# Patient Record
Sex: Male | Born: 1950 | Race: White | Hispanic: No | Marital: Married | Smoking: Never smoker
Health system: Southern US, Community
[De-identification: ages and names within clinical notes are randomized; demographics above are authoritative.]

## PROBLEM LIST (undated history)

## (undated) DIAGNOSIS — N200 Calculus of kidney: Secondary | ICD-10-CM

## (undated) DIAGNOSIS — M778 Other enthesopathies, not elsewhere classified: Secondary | ICD-10-CM

## (undated) DIAGNOSIS — N189 Chronic kidney disease, unspecified: Secondary | ICD-10-CM

## (undated) DIAGNOSIS — K429 Umbilical hernia without obstruction or gangrene: Secondary | ICD-10-CM

## (undated) DIAGNOSIS — M1990 Unspecified osteoarthritis, unspecified site: Secondary | ICD-10-CM

## (undated) DIAGNOSIS — K589 Irritable bowel syndrome without diarrhea: Secondary | ICD-10-CM

## (undated) DIAGNOSIS — M722 Plantar fascial fibromatosis: Secondary | ICD-10-CM

## (undated) DIAGNOSIS — I714 Abdominal aortic aneurysm, without rupture, unspecified: Secondary | ICD-10-CM

## (undated) DIAGNOSIS — K2970 Gastritis, unspecified, without bleeding: Secondary | ICD-10-CM

## (undated) DIAGNOSIS — E785 Hyperlipidemia, unspecified: Secondary | ICD-10-CM

## (undated) DIAGNOSIS — I499 Cardiac arrhythmia, unspecified: Secondary | ICD-10-CM

## (undated) DIAGNOSIS — I10 Essential (primary) hypertension: Secondary | ICD-10-CM

## (undated) HISTORY — PX: CATARACT EXTRACTION: SUR2

## (undated) HISTORY — PX: EYE SURGERY: SHX253

## (undated) HISTORY — PX: JOINT REPLACEMENT: SHX530

## (undated) HISTORY — PX: TONSILLECTOMY: SUR1361

---

## 2008-06-20 ENCOUNTER — Ambulatory Visit: Payer: Self-pay | Admitting: Internal Medicine

## 2008-11-07 ENCOUNTER — Ambulatory Visit: Payer: Self-pay | Admitting: Ophthalmology

## 2008-11-07 ENCOUNTER — Ambulatory Visit: Payer: Self-pay | Admitting: Cardiovascular Disease

## 2008-11-13 ENCOUNTER — Ambulatory Visit: Payer: Self-pay | Admitting: Ophthalmology

## 2008-12-22 ENCOUNTER — Ambulatory Visit: Payer: Self-pay | Admitting: Unknown Physician Specialty

## 2009-04-03 ENCOUNTER — Ambulatory Visit: Payer: Self-pay | Admitting: Internal Medicine

## 2009-04-20 ENCOUNTER — Ambulatory Visit: Payer: Self-pay | Admitting: Unknown Physician Specialty

## 2009-05-30 ENCOUNTER — Ambulatory Visit: Payer: Self-pay | Admitting: Unknown Physician Specialty

## 2010-11-19 ENCOUNTER — Ambulatory Visit: Payer: Self-pay | Admitting: Internal Medicine

## 2011-03-19 ENCOUNTER — Ambulatory Visit: Payer: Self-pay | Admitting: Internal Medicine

## 2011-10-14 ENCOUNTER — Ambulatory Visit: Payer: Self-pay | Admitting: Ophthalmology

## 2011-10-14 LAB — POTASSIUM: Potassium: 3.4 mmol/L — ABNORMAL LOW (ref 3.5–5.1)

## 2011-10-24 ENCOUNTER — Ambulatory Visit: Payer: Self-pay | Admitting: Ophthalmology

## 2012-01-22 IMAGING — CT CT CHEST W/ CM
1 series · 15 of 33 positions shown, 19 images · IV contrast (APPLIED)
Comparison: CT abdomen 04/03/2009

REASON FOR EXAM: Plueritic  Chest Pains Hemopolysis
COMMENTS:

PROCEDURE:     KCT - KCT CHEST WITH CONTRAST  - March 19, 2011  [DATE]
RESULT:     Indication: Pleuritic chest pain
TECHNIQUE: Multiple axial images of the chest are obtained with 100 mL of
Tsovue-0JY intravenous contrast.

[Series 4: pe_avg 3.0 i41f 3 · axial · 0.84mm/px · z∈[-874,-598]mm · 15 of 110 slices shown, 19 images]
[im 9/110  mediastinal]
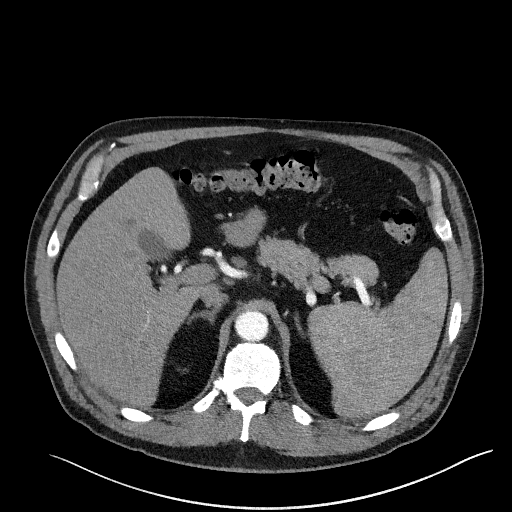
[im 9/110  lung]
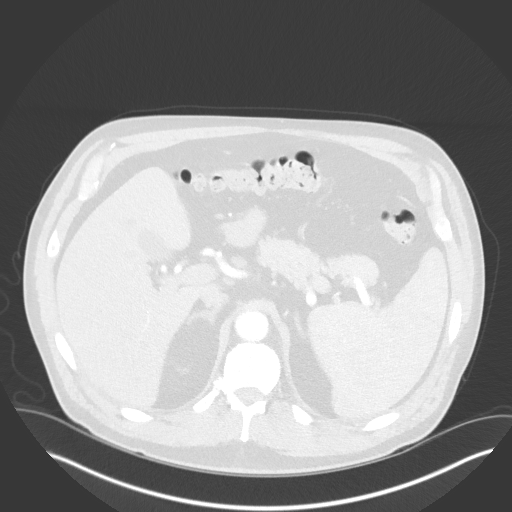
[im 17/110  lung]
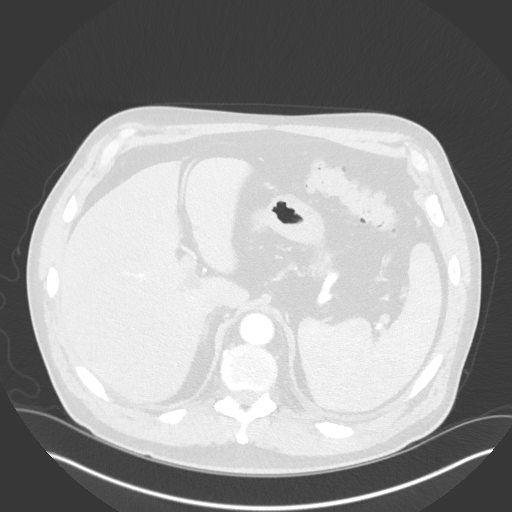
[im 22/110  lung]
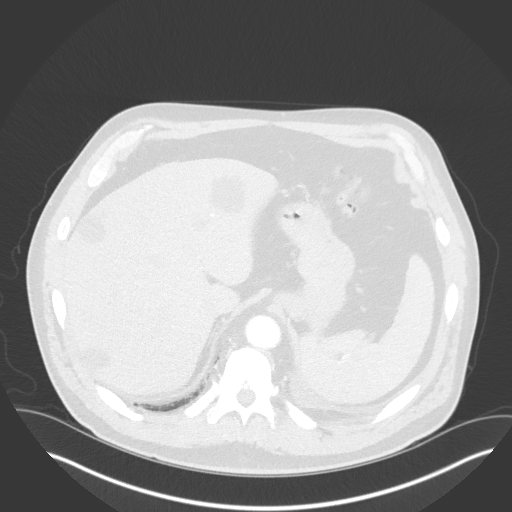
[im 29/110  lung]
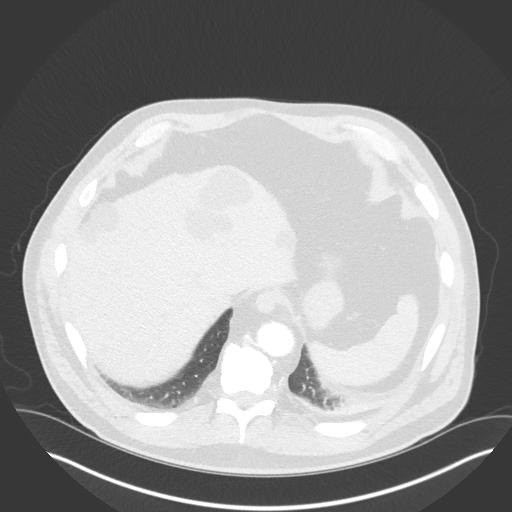
[im 37/110  mediastinal]
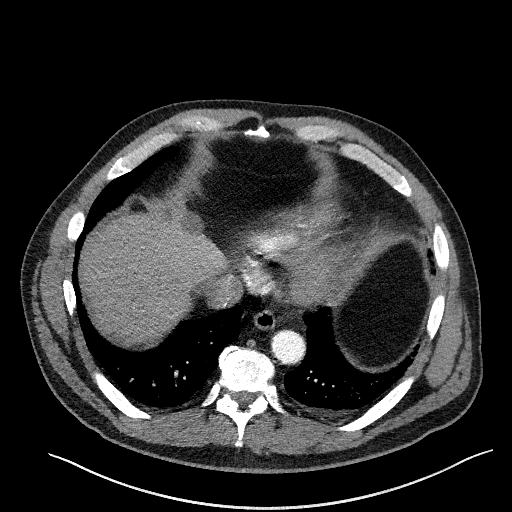
[im 37/110  lung]
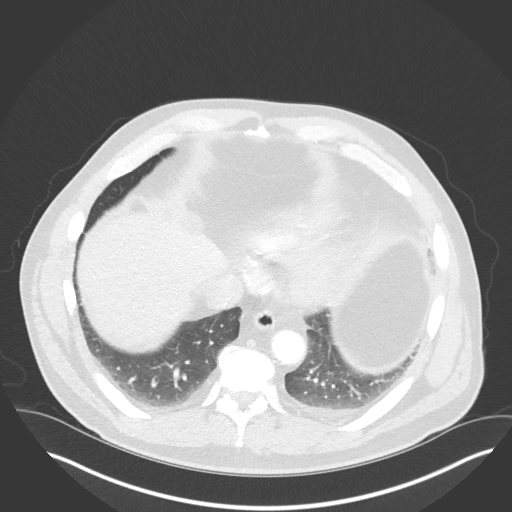
[im 44/110  lung]
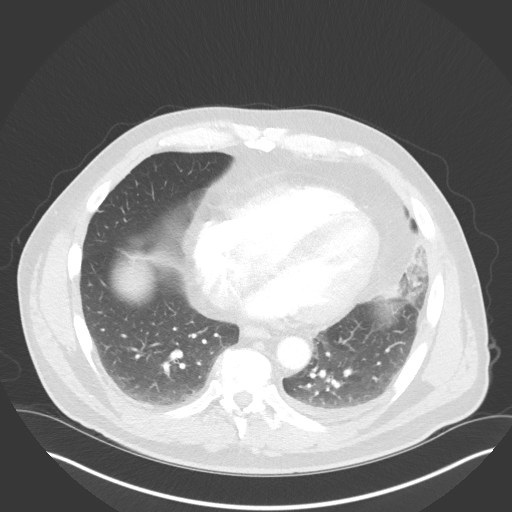
[im 49/110  lung]
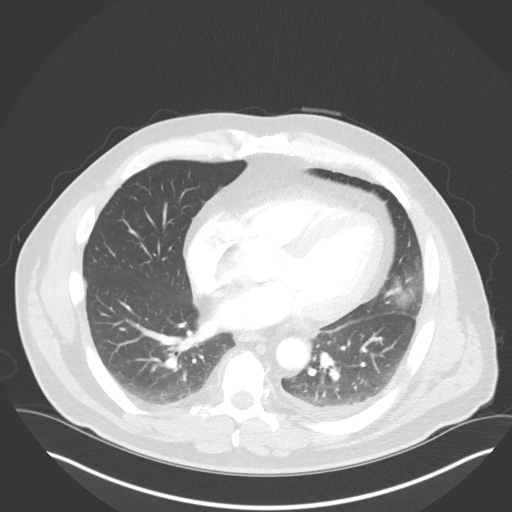
[im 57/110  lung]
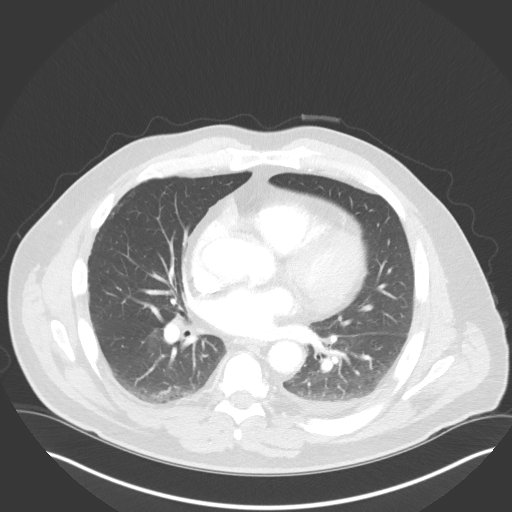
[im 61/110  mediastinal]
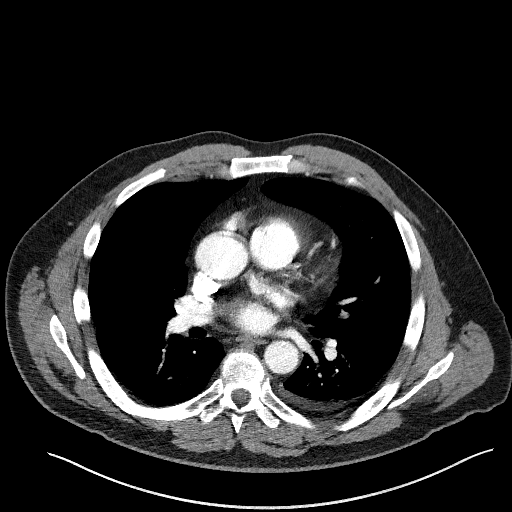
[im 61/110  lung]
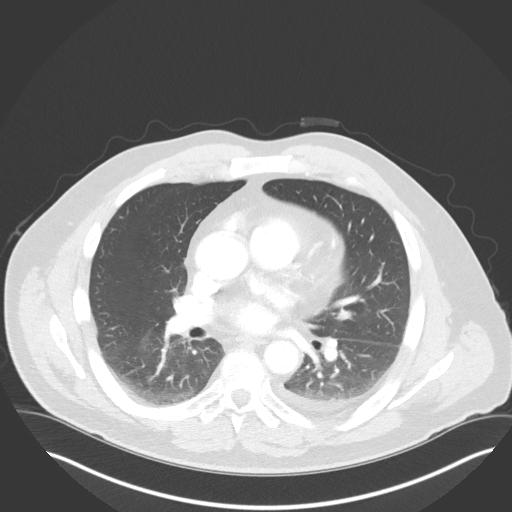
[im 66/110  lung]
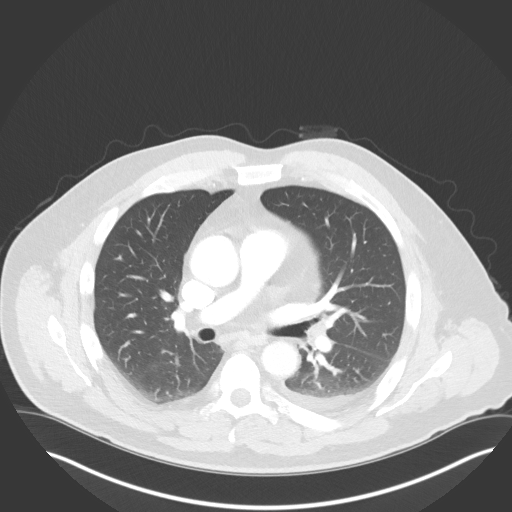
[im 73/110  lung]
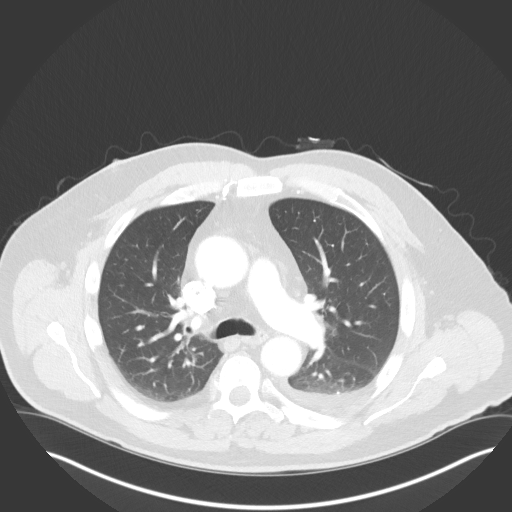
[im 81/110  lung]
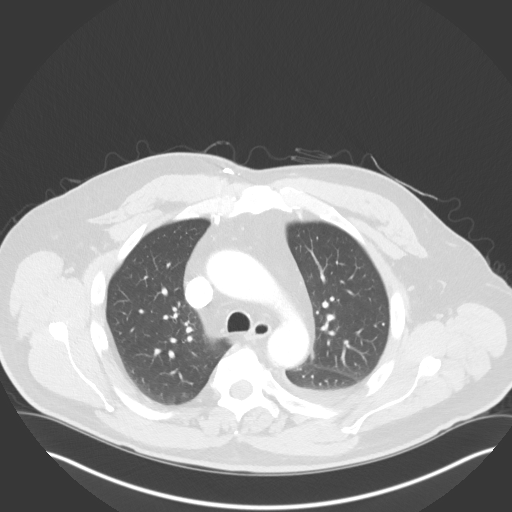
[im 88/110  mediastinal]
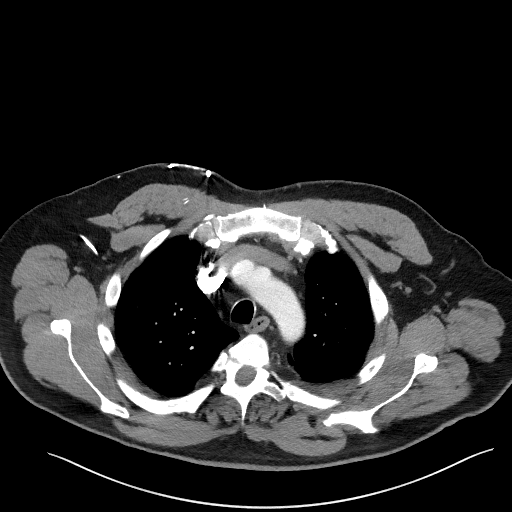
[im 88/110  lung]
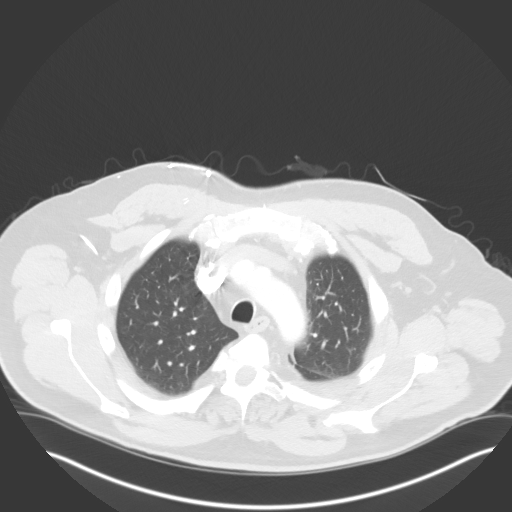
[im 93/110  lung]
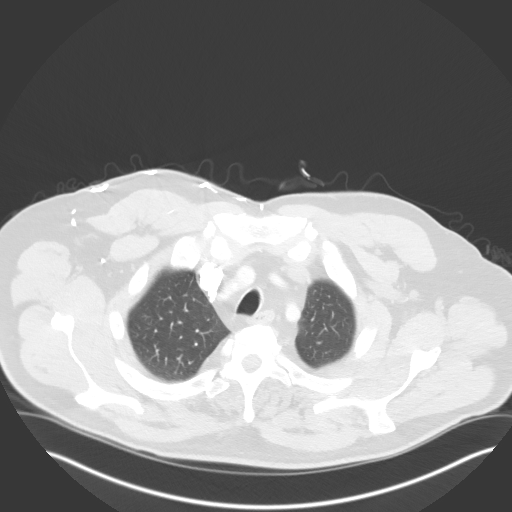
[im 101/110  lung]
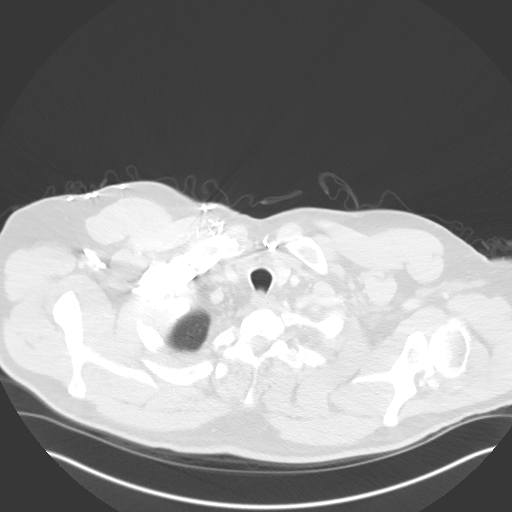

[15 of 33 positions shown; findings below may reference images not displayed]

FINDINGS: The central airways are patent. There is a trace left pleural effusion.
There is lingular airspace disease most concerning for pneumonia. There is
no pleural effusion or pneumothorax.

There are no pathologically enlarged axillary, hilar, or mediastinal lymph
nodes.

The heart size is normal. There is no pericardial effusion. The thoracic
aorta is normal in caliber.

Review of bone windows demonstrates no focal lytic or sclerotic lesions.

Limited noncontrast images of the upper abdomen were obtained. The adrenal
glands appear normal. There multiple hypodense, fluid attenuating hepatic
mass is similar in appearance to 04/03/2009 most consistent with cysts or
hamartomas..
IMPRESSION: 1. Lingular airspace disease most concerning for pneumonia. There is a trace
left pleural effusion .

## 2013-09-28 ENCOUNTER — Ambulatory Visit: Payer: Self-pay | Admitting: Internal Medicine

## 2013-11-30 ENCOUNTER — Ambulatory Visit: Payer: Self-pay | Admitting: Urology

## 2013-12-01 ENCOUNTER — Ambulatory Visit: Payer: Self-pay | Admitting: Urology

## 2013-12-01 LAB — CBC WITH DIFFERENTIAL/PLATELET
BASOS ABS: 0 10*3/uL (ref 0.0–0.1)
Basophil %: 0.4 %
EOS ABS: 0 10*3/uL (ref 0.0–0.7)
Eosinophil %: 0 %
HCT: 39.7 % — ABNORMAL LOW (ref 40.0–52.0)
HGB: 13.4 g/dL (ref 13.0–18.0)
Lymphocyte #: 0.3 10*3/uL — ABNORMAL LOW (ref 1.0–3.6)
Lymphocyte %: 2.8 %
MCH: 28.2 pg (ref 26.0–34.0)
MCHC: 33.8 g/dL (ref 32.0–36.0)
MCV: 84 fL (ref 80–100)
MONOS PCT: 7.7 %
Monocyte #: 0.9 x10 3/mm (ref 0.2–1.0)
NEUTROS ABS: 10.1 10*3/uL — AB (ref 1.4–6.5)
NEUTROS PCT: 89.1 %
Platelet: 183 10*3/uL (ref 150–440)
RBC: 4.76 10*6/uL (ref 4.40–5.90)
RDW: 15.5 % — ABNORMAL HIGH (ref 11.5–14.5)
WBC: 11.3 10*3/uL — AB (ref 3.8–10.6)

## 2013-12-01 LAB — BASIC METABOLIC PANEL
ANION GAP: 7 (ref 7–16)
BUN: 20 mg/dL — ABNORMAL HIGH (ref 7–18)
CALCIUM: 8.3 mg/dL — AB (ref 8.5–10.1)
Chloride: 100 mmol/L (ref 98–107)
Co2: 29 mmol/L (ref 21–32)
Creatinine: 2.17 mg/dL — ABNORMAL HIGH (ref 0.60–1.30)
EGFR (African American): 36 — ABNORMAL LOW
GFR CALC NON AF AMER: 31 — AB
Glucose: 168 mg/dL — ABNORMAL HIGH (ref 65–99)
OSMOLALITY: 278 (ref 275–301)
POTASSIUM: 3 mmol/L — AB (ref 3.5–5.1)
Sodium: 136 mmol/L (ref 136–145)

## 2013-12-03 LAB — URINE CULTURE

## 2013-12-06 LAB — CULTURE, BLOOD (SINGLE)

## 2014-08-26 NOTE — Op Note (Signed)
PATIENT NAME:  Jeremy Lawrence, Jeremy Lawrence MR#:  [id number] DATE OF BIRTH:  1950-08-26  DATE OF PROCEDURE:  12/02/2013  PREOPERATIVE DIAGNOSES:  1. Left ureterolithiasis.  2. Urinary tract infection.   POSTOPERATIVE DIAGNOSES:   1. Left ureterolithiasis.  2. Urinary tract infection.   PROCEDURE:   1. Cystoscopy with left stent placement.  2. Fluoroscopy.   SURGEON: Maryan Puls, M.D.   ANESTHETIST: Thomas.  ANESTHETIC METHOD: General.   INDICATIONS: See the dictated history and physical. After informed consent, the patient requests the above procedure.   OPERATIVE SUMMARY: After adequate general anesthesia had been obtained, the patient was placed into dorsal lithotomy position and the perineum was prepped and draped in the usual fashion. Fluoroscopy was then performed confirming presence of a distal left ureteral stone. At this point the 38 [place] scope was coupled with the camera and then visually advanced into the bladder. The bladder was thoroughly inspected. No bladder lesions were identified. A 0.25 Glidewire was  then advanced up the left ureter, beyond the stone. A 6 French open-ended ureteral catheter was then passed over the Glidewire. The 0.25 Glidewire was then removed and replaced with a 0.035 Glidewire. Open ended catheter was removed taking care to leave the 0.35 Glidewire in position. A 6 x 26 cm double pigtail stent was then advanced over the guidewire and positioned in the ureter. The guidewire was then removed taking care to leave the stent in position. The bladder was drained and cystoscope was removed. Ten mL of viscous Xylocaine was instilled within the urethra and the bladder. B and O suppository was placed. The procedure was then terminated and the patient was transferred to the recovery room in stable condition    ____________________________ Otelia Limes. Yves Dill, MD [id number] D: 12/02/2013 00:10:04 ET T: 12/02/2013 00:42:31 ET JOB#: [id number]  cc: Otelia Limes. Yves Dill, MD,  <Dictator> Royston Cowper MD ELECTRONICALLY SIGNED 12/02/2013 10:22

## 2014-08-27 NOTE — Op Note (Signed)
PATIENT NAME:  Jeremy Lawrence, Jeremy Lawrence MR#:  [id number] DATE OF BIRTH:  [birth year]  DATE OF PROCEDURE:  10/24/2011  PREOPERATIVE DIAGNOSIS:  Cataract, right eye.  POSTOPERATIVE DIAGNOSIS:  Cataract, right eye.  PROCEDURE PERFORMED:  Extracapsular cataract extraction using phacoemulsification with placement of an Alcon [id number], 21.0-diopter posterior chamber lens, serial #[id number].    SURGEON:  Loura Back. Lelah Rennaker, MD  ASSISTANT:  None.  ANESTHESIA:  4% lidocaine and 0.75% Marcaine in a 50/50 mixture with 10 units/mL of Hylenex added, given as a peribulbar.  ANESTHESIOLOGIST:  Alvin Critchley, MD   COMPLICATIONS:  None.  ESTIMATED BLOOD LOSS:  Less than 1 mL.  DESCRIPTION OF PROCEDURE:  The patient was brought to the operating room and given a peribulbar block.  The patient was then prepped and draped in the usual fashion.  The vertical rectus muscles were imbricated using 5-0 silk sutures.  These sutures were then clamped to the sterile drapes as bridle sutures.  A limbal peritomy was performed extending two clock hours and hemostasis was obtained with cautery.  A partial thickness scleral groove was made at the surgical limbus and then dissected anteriorly in a lamellar dissection with using an Alcon crescent knife.  The anterior chamber was entered superonasally with a Superblade and through the lamellar dissection with a 2.6-mm keratome.  DisCoVisc was used to replace the aqueous and a continuous tear capsulorrhexis was carried out.  Hydrodissection and hydrodelineation were carried out with balanced salt and a 27 gauge canula.  The nucleus was rotated to confirm the effectiveness of the hydrodissection.  Phacoemulsification was carried out using a divide-and-conquer technique.  Total ultrasound time was 1 minute and 47 seconds with an average power of 16.3 percent. CDE of 28.81.  Irrigation/aspiration was used to remove the residual cortex.  DisCoVisc was used to inflate the capsule and  the internal wound was enlarged to 3 mm with the crescent knife.  The intraocular lens was inserted into the capsular bag using the AcrySert Delivery System.   Irrigation/aspiration was used to remove the residual DisCoVisc.  Miostat was injected into the anterior chamber through the paracentesis track to inflate the anterior chamber and induce miosis.  The wound was checked for leaks and wound leakage was found.  A single 10-0 suture was placed across the incision, tied and the knot was rotated superiorly.  The conjunctiva was closed with cautery and the bridle sutures were removed.  Two drops of 0.3% Vigamox were placed on the eye.  An eye shield was placed on the eye.  The patient was discharged to the recovery room in good condition.  ____________________________ Loura Back Heavenleigh Petruzzi, MD sad:cbb D: 10/24/2011 12:08:31 ET T: 10/24/2011 12:29:54 ET JOB#: [id number]  cc: Remo Lipps A. Mana Morison, MD, <Dictator> Martie Lee MD ELECTRONICALLY SIGNED 10/27/2011 14:37

## 2014-11-16 ENCOUNTER — Encounter: Payer: Self-pay | Admitting: *Deleted

## 2014-11-17 ENCOUNTER — Ambulatory Visit
Admission: RE | Admit: 2014-11-17 | Discharge: 2014-11-17 | Disposition: A | Discharge status: Home / Self Care | Payer: BLUE CROSS/BLUE SHIELD | Source: Ambulatory Visit | Attending: Unknown Physician Specialty | Admitting: Unknown Physician Specialty

## 2014-11-17 ENCOUNTER — Ambulatory Visit: Payer: BLUE CROSS/BLUE SHIELD | Admitting: Anesthesiology

## 2014-11-17 ENCOUNTER — Encounter
Admission: RE | Disposition: A | Discharge status: Home / Self Care | Payer: Self-pay | Source: Ambulatory Visit | Attending: Unknown Physician Specialty

## 2014-11-17 ENCOUNTER — Encounter: Payer: Self-pay | Admitting: Anesthesiology

## 2014-11-17 DIAGNOSIS — Z87442 Personal history of urinary calculi: Secondary | ICD-10-CM | POA: Diagnosis not present

## 2014-11-17 DIAGNOSIS — K2970 Gastritis, unspecified, without bleeding: Secondary | ICD-10-CM | POA: Diagnosis not present

## 2014-11-17 DIAGNOSIS — M778 Other enthesopathies, not elsewhere classified: Secondary | ICD-10-CM | POA: Insufficient documentation

## 2014-11-17 DIAGNOSIS — K5730 Diverticulosis of large intestine without perforation or abscess without bleeding: Secondary | ICD-10-CM | POA: Diagnosis not present

## 2014-11-17 DIAGNOSIS — M722 Plantar fascial fibromatosis: Secondary | ICD-10-CM | POA: Diagnosis not present

## 2014-11-17 DIAGNOSIS — N189 Chronic kidney disease, unspecified: Secondary | ICD-10-CM | POA: Diagnosis not present

## 2014-11-17 DIAGNOSIS — I714 Abdominal aortic aneurysm, without rupture: Secondary | ICD-10-CM | POA: Diagnosis not present

## 2014-11-17 DIAGNOSIS — Z7982 Long term (current) use of aspirin: Secondary | ICD-10-CM | POA: Diagnosis not present

## 2014-11-17 DIAGNOSIS — E785 Hyperlipidemia, unspecified: Secondary | ICD-10-CM | POA: Insufficient documentation

## 2014-11-17 DIAGNOSIS — I129 Hypertensive chronic kidney disease with stage 1 through stage 4 chronic kidney disease, or unspecified chronic kidney disease: Secondary | ICD-10-CM | POA: Insufficient documentation

## 2014-11-17 DIAGNOSIS — K429 Umbilical hernia without obstruction or gangrene: Secondary | ICD-10-CM | POA: Diagnosis not present

## 2014-11-17 DIAGNOSIS — Z96642 Presence of left artificial hip joint: Secondary | ICD-10-CM | POA: Insufficient documentation

## 2014-11-17 DIAGNOSIS — D124 Benign neoplasm of descending colon: Secondary | ICD-10-CM | POA: Insufficient documentation

## 2014-11-17 DIAGNOSIS — Z86010 Personal history of colonic polyps: Secondary | ICD-10-CM | POA: Diagnosis present

## 2014-11-17 DIAGNOSIS — D123 Benign neoplasm of transverse colon: Secondary | ICD-10-CM | POA: Diagnosis not present

## 2014-11-17 DIAGNOSIS — M1990 Unspecified osteoarthritis, unspecified site: Secondary | ICD-10-CM | POA: Insufficient documentation

## 2014-11-17 DIAGNOSIS — Z9849 Cataract extraction status, unspecified eye: Secondary | ICD-10-CM | POA: Diagnosis not present

## 2014-11-17 DIAGNOSIS — D122 Benign neoplasm of ascending colon: Secondary | ICD-10-CM | POA: Diagnosis not present

## 2014-11-17 DIAGNOSIS — K589 Irritable bowel syndrome without diarrhea: Secondary | ICD-10-CM | POA: Insufficient documentation

## 2014-11-17 DIAGNOSIS — K640 First degree hemorrhoids: Secondary | ICD-10-CM | POA: Diagnosis not present

## 2014-11-17 DIAGNOSIS — Z7951 Long term (current) use of inhaled steroids: Secondary | ICD-10-CM | POA: Insufficient documentation

## 2014-11-17 DIAGNOSIS — D125 Benign neoplasm of sigmoid colon: Secondary | ICD-10-CM | POA: Insufficient documentation

## 2014-11-17 DIAGNOSIS — Z79899 Other long term (current) drug therapy: Secondary | ICD-10-CM | POA: Diagnosis not present

## 2014-11-17 HISTORY — DX: Unspecified osteoarthritis, unspecified site: M19.90

## 2014-11-17 HISTORY — DX: Abdominal aortic aneurysm, without rupture: I71.4

## 2014-11-17 HISTORY — DX: Hyperlipidemia, unspecified: E78.5

## 2014-11-17 HISTORY — DX: Umbilical hernia without obstruction or gangrene: K42.9

## 2014-11-17 HISTORY — DX: Irritable bowel syndrome, unspecified: K58.9

## 2014-11-17 HISTORY — DX: Abdominal aortic aneurysm, without rupture, unspecified: I71.40

## 2014-11-17 HISTORY — DX: Other enthesopathies, not elsewhere classified: M77.8

## 2014-11-17 HISTORY — DX: Essential (primary) hypertension: I10

## 2014-11-17 HISTORY — PX: COLONOSCOPY WITH PROPOFOL: SHX5780

## 2014-11-17 HISTORY — DX: Chronic kidney disease, unspecified: N18.9

## 2014-11-17 HISTORY — DX: Plantar fascial fibromatosis: M72.2

## 2014-11-17 HISTORY — DX: Gastritis, unspecified, without bleeding: K29.70

## 2014-11-17 SURGERY — COLONOSCOPY WITH PROPOFOL
Anesthesia: General

## 2014-11-17 MED ORDER — SODIUM CHLORIDE 0.9 % IV SOLN
INTRAVENOUS | Status: DC
  Administered 2014-11-17: 1000 mL via INTRAVENOUS

## 2014-11-17 MED ORDER — GENTAMICIN SULFATE 40 MG/ML IJ SOLN
100.0000 mg | Freq: Once | INTRAMUSCULAR | Status: AC
  Administered 2014-11-17 (×2): 100 mg via INTRAVENOUS
  Filled 2014-11-17: qty 2.5

## 2014-11-17 MED ORDER — EPHEDRINE SULFATE 50 MG/ML IJ SOLN
INTRAMUSCULAR | Status: DC | PRN
  Administered 2014-11-17 (×2): 10 mg via INTRAVENOUS

## 2014-11-17 MED ORDER — SODIUM CHLORIDE 0.9 % IV SOLN: INTRAVENOUS | Status: DC

## 2014-11-17 MED ORDER — MIDAZOLAM HCL 5 MG/5ML IJ SOLN: INTRAMUSCULAR | Status: DC | PRN

## 2014-11-17 MED ORDER — PROPOFOL INFUSION 10 MG/ML OPTIME
INTRAVENOUS | Status: DC | PRN
Start: 2014-11-17 — End: 2014-11-17
  Administered 2014-11-17: 120 ug/kg/min via INTRAVENOUS

## 2014-11-17 MED ORDER — VANCOMYCIN HCL IN DEXTROSE 1-5 GM/200ML-% IV SOLN
1000.0000 mg | Freq: Once | INTRAVENOUS | Status: AC
  Administered 2014-11-17: 1000 mg via INTRAVENOUS
  Filled 2014-11-17: qty 200

## 2014-11-17 MED ORDER — FENTANYL CITRATE (PF) 100 MCG/2ML IJ SOLN
INTRAMUSCULAR | Status: DC | PRN
  Administered 2014-11-17: 50 ug via INTRAVENOUS

## 2014-11-17 MED ORDER — GLYCOPYRROLATE 0.2 MG/ML IJ SOLN
INTRAMUSCULAR | Status: DC | PRN
  Administered 2014-11-17: 0.2 mg via INTRAVENOUS

## 2014-11-17 MED ORDER — GENTAMICIN IN SALINE 1-0.9 MG/ML-% IV SOLN: 100.0000 mg | Freq: Once | INTRAVENOUS | Status: DC

## 2014-11-17 MED ORDER — PROPOFOL 10 MG/ML IV BOLUS
INTRAVENOUS | Status: DC | PRN
  Administered 2014-11-17: 55 mg via INTRAVENOUS

## 2014-11-17 MED ORDER — MIDAZOLAM HCL 5 MG/5ML IJ SOLN
INTRAMUSCULAR | Status: DC | PRN
  Administered 2014-11-17: 1.5 mg via INTRAVENOUS

## 2014-11-17 NOTE — Anesthesia Preprocedure Evaluation (Signed)
Anesthesia Evaluation  Patient identified by MRN, date of birth, ID band Patient awake    Reviewed: Allergy & Precautions, NPO status , Patient's Chart, lab work & pertinent test results, reviewed documented beta blocker date and time   Airway Mallampati: II  TM Distance: >3 FB     Dental  (+) Chipped   Pulmonary          Cardiovascular hypertension, + Peripheral Vascular Disease     Neuro/Psych    GI/Hepatic   Endo/Other    Renal/GU Renal InsufficiencyRenal disease     Musculoskeletal  (+) Arthritis -,   Abdominal   Peds  Hematology   Anesthesia Other Findings Does not use albuterol anymore.  Reproductive/Obstetrics                             Anesthesia Physical Anesthesia Plan  ASA: III  Anesthesia Plan: General   Post-op Pain Management:    Induction: Intravenous  Airway Management Planned: Nasal Cannula  Additional Equipment:   Intra-op Plan:   Post-operative Plan:   Informed Consent: I have reviewed the patients History and Physical, chart, labs and discussed the procedure including the risks, benefits and alternatives for the proposed anesthesia with the patient or authorized representative who has indicated his/her understanding and acceptance.     Plan Discussed with: CRNA  Anesthesia Plan Comments:         Anesthesia Quick Evaluation

## 2014-11-17 NOTE — Op Note (Signed)
[facility] Gastroenterology Patient Name: Jeremy Lawrence Procedure Date: 11/17/2014 9:33 AM MRN: [id number] Account #: [id number] Date of Birth: 05/01/1951 Admit Type: Outpatient Age: 64 Room: [facility] ENDO ROOM 1 Gender: Male Note Status: Finalized Procedure:         Colonoscopy Indications:       Personal history of colonic polyps Providers:         Manya Silvas, MD Referring MD:      Leonie Douglas. Doy Hutching, MD (Referring MD) Medicines:         Propofol per Anesthesia Complications:     No immediate complications. Procedure:         Pre-Anesthesia Assessment:                    - After reviewing the risks and benefits, the patient was                     deemed in satisfactory condition to undergo the procedure.                    After obtaining informed consent, the colonoscope was                     passed under direct vision. Throughout the procedure, the                     patient's blood pressure, pulse, and oxygen saturations                     were monitored continuously. The Colonoscope was                     introduced through the anus and advanced to the the cecum,                     identified by appendiceal orifice and ileocecal valve. The                     colonoscopy was performed without difficulty. The patient                     tolerated the procedure well. The quality of the bowel                     preparation was good. Findings:      A small polyp was found in the ascending colon. The polyp was sessile.       The polyp was removed with a hot snare. Resection and retrieval were       complete. One clip applied to he site.      A diminutive polyp was found in the ascending colon. The polyp was       sessile. The polyp was removed with a jumbo cold forceps. Resection and       retrieval were complete.      Three sessile polyps were found in the transverse colon. The polyps were       diminutive in size. These polyps were removed with  a jumbo cold forceps.       Resection and retrieval were complete.      Two sessile polyps were found in the descending colon. The polyps were       diminutive in size. These polyps were removed with a jumbo cold forceps.  Resection and retrieval were complete.      Two sessile polyps were found in the sigmoid colon. The polyps were       diminutive in size. These polyps were removed with a jumbo cold forceps.       Resection and retrieval were complete.      Many small-mouthed diverticula were found in the sigmoid colon and in       the descending colon.      Internal hemorrhoids were found during endoscopy. The hemorrhoids were       small, medium-sized and Grade I (internal hemorrhoids that do not       prolapse).      No additional abnormalities were found on retroflexion. Impression:        - One small polyp in the ascending colon. Resected and                     retrieved.                    - One diminutive polyp in the ascending colon. Resected                     and retrieved.                    - Three diminutive polyps in the transverse colon.                     Resected and retrieved.                    - Two diminutive polyps in the descending colon. Resected                     and retrieved.                    - Two diminutive polyps in the sigmoid colon. Resected and                     retrieved.                    - Diverticulosis in the sigmoid colon and in the                     descending colon.                    - Internal hemorrhoids. Recommendation:    - Await pathology results. Manya Silvas, MD 11/17/2014 10:46:45 AM This report has been signed electronically. Number of Addenda: 0 Note Initiated On: 11/17/2014 9:33 AM Scope Withdrawal Time: 0 hours 23 minutes 5 seconds  Total Procedure Duration: 0 hours 26 minutes 37 seconds       [facility]

## 2014-11-17 NOTE — H&P (Signed)
Primary Care Physician:  Idelle Crouch, MD Primary Gastroenterologist:  Dr. Vira Agar  Pre-Procedure History & Physical: HPI:  Jeremy Lawrence is a 64 y.o. male is here for an colonoscopy.   Past Medical History  Diagnosis Date  . IBS (irritable bowel syndrome)   . Arthritis   . Plantar fasciitis   . Umbilical hernia   . AAA (abdominal aortic aneurysm)   . Gastritis   . Hypertension   . Hyperlipidemia   . Tendinitis of elbow   . Chronic kidney disease     Kidney Stone    Past Surgical History  Procedure Laterality Date  . Colon surgery    . Joint replacement      LT Hip  . Eye surgery      Cataract Extraction    Prior to Admission medications   Medication Sig Start Date End Date Taking? Authorizing Provider  albuterol (PROVENTIL HFA;VENTOLIN HFA) 108 (90 BASE) MCG/ACT inhaler Inhale 2 puffs into the lungs every 6 (six) hours as needed for wheezing or shortness of breath.   Yes Historical Provider, MD  amLODipine (NORVASC) 10 MG tablet Take 10 mg by mouth daily.   Yes Historical Provider, MD  aspirin EC 81 MG tablet Take 81 mg by mouth daily.   Yes Historical Provider, MD  losartan-hydrochlorothiazide (HYZAAR) 100-25 MG per tablet Take 1 tablet by mouth daily.   Yes Historical Provider, MD  sertraline (ZOLOFT) 25 MG tablet Take 25 mg by mouth daily.   Yes Historical Provider, MD  zolpidem (AMBIEN) 10 MG tablet Take 10 mg by mouth at bedtime as needed for sleep.   Yes Historical Provider, MD    Allergies as of 10/04/2014  . (Not on File)    History reviewed. No pertinent family history.  History   Social History  . Marital Status: Married    Spouse Name: N/A  . Number of Children: N/A  . Years of Education: N/A   Occupational History  . Not on file.   Social History Main Topics  . Smoking status: Never Smoker   . Smokeless tobacco: Never Used  . Alcohol Use: No  . Drug Use: No  . Sexual Activity: Not on file   Other Topics Concern  . Not on file    Social History Narrative    Review of Systems: See HPI, otherwise negative ROS  Physical Exam: BP 116/73 mmHg  Pulse 48  Temp(Src) 97.3 F (36.3 C) (Oral)  Resp 22  Ht 5\' 10"  (1.778 m)  Wt 97.523 kg (215 lb)  BMI 30.85 kg/m2  SpO2 100% General:   Alert,  pleasant and cooperative in NAD Head:  Normocephalic and atraumatic. Neck:  Supple; no masses or thyromegaly. Lungs:  Clear throughout to auscultation.    Heart:  Regular rate and rhythm. Abdomen:  Soft, nontender and nondistended. Normal bowel sounds, without guarding, and without rebound.   Neurologic:  Alert and  oriented x4;  grossly normal neurologically.  Impression/Plan: Jeremy Lawrence is here for an colonoscopy to be performed for personal history of colon polyps  Risks, benefits, limitations, and alternatives regarding  colonoscopy have been reviewed with the patient.  Questions have been answered.  All parties agreeable.   Gaylyn Cheers, MD  11/17/2014, 9:49 AM   Primary Care Physician:  Idelle Crouch, MD Primary Gastroenterologist:  Dr. Vira Agar  Pre-Procedure History & Physical: HPI:  Jeremy Lawrence is a 64 y.o. male is here for an colonoscopy.   Past Medical History  Diagnosis Date  . IBS (irritable bowel syndrome)   . Arthritis   . Plantar fasciitis   . Umbilical hernia   . AAA (abdominal aortic aneurysm)   . Gastritis   . Hypertension   . Hyperlipidemia   . Tendinitis of elbow   . Chronic kidney disease     Kidney Stone    Past Surgical History  Procedure Laterality Date  . Colon surgery    . Joint replacement      LT Hip  . Eye surgery      Cataract Extraction    Prior to Admission medications   Medication Sig Start Date End Date Taking? Authorizing Provider  albuterol (PROVENTIL HFA;VENTOLIN HFA) 108 (90 BASE) MCG/ACT inhaler Inhale 2 puffs into the lungs every 6 (six) hours as needed for wheezing or shortness of breath.   Yes Historical Provider, MD  amLODipine  (NORVASC) 10 MG tablet Take 10 mg by mouth daily.   Yes Historical Provider, MD  aspirin EC 81 MG tablet Take 81 mg by mouth daily.   Yes Historical Provider, MD  losartan-hydrochlorothiazide (HYZAAR) 100-25 MG per tablet Take 1 tablet by mouth daily.   Yes Historical Provider, MD  sertraline (ZOLOFT) 25 MG tablet Take 25 mg by mouth daily.   Yes Historical Provider, MD  zolpidem (AMBIEN) 10 MG tablet Take 10 mg by mouth at bedtime as needed for sleep.   Yes Historical Provider, MD    Allergies as of 10/04/2014  . (Not on File)    History reviewed. No pertinent family history.  History   Social History  . Marital Status: Married    Spouse Name: N/A  . Number of Children: N/A  . Years of Education: N/A   Occupational History  . Not on file.   Social History Main Topics  . Smoking status: Never Smoker   . Smokeless tobacco: Never Used  . Alcohol Use: No  . Drug Use: No  . Sexual Activity: Not on file   Other Topics Concern  . Not on file   Social History Narrative    Review of Systems: See HPI, otherwise negative ROS  Physical Exam: BP 116/73 mmHg  Pulse 48  Temp(Src) 97.3 F (36.3 C) (Oral)  Resp 22  Ht 5\' 10"  (1.778 m)  Wt 97.523 kg (215 lb)  BMI 30.85 kg/m2  SpO2 100% General:   Alert,  pleasant and cooperative in NAD Head:  Normocephalic and atraumatic. Neck:  Supple; no masses or thyromegaly. Lungs:  Clear throughout to auscultation.    Heart:  Regular rate and rhythm. Abdomen:  Soft, nontender and nondistended. Normal bowel sounds, without guarding, and without rebound.   Neurologic:  Alert and  oriented x4;  grossly normal neurologically.  Impression/Plan: Jeremy Lawrence is here for an colonoscopy to be performed for personal history of colon polyps  Risks, benefits, limitations, and alternatives regarding  colonoscopy have been reviewed with the patient.  Questions have been answered.  All parties agreeable.   Gaylyn Cheers, MD  11/17/2014,  9:49 AM

## 2014-11-17 NOTE — Anesthesia Postprocedure Evaluation (Signed)
  Anesthesia Post-op Note  Patient: Jeremy Lawrence  Procedure(s) Performed: Procedure(s): COLONOSCOPY WITH PROPOFOL (N/A)  Anesthesia type:General  Patient location: PACU  Post pain: Pain level controlled  Post assessment: Post-op Vital signs reviewed, Patient's Cardiovascular Status Stable, Respiratory Function Stable, Patent Airway and No signs of Nausea or vomiting  Post vital signs: Reviewed and stable  Last Vitals:  Filed Vitals:   11/17/14 1120  BP: 106/63  Pulse: 51  Temp:   Resp: 13    Level of consciousness: awake, alert  and patient cooperative  Complications: No apparent anesthesia complications

## 2014-11-17 NOTE — Anesthesia Procedure Notes (Signed)
Date/Time: 11/17/2014 10:00 AM Performed by: Allean Found Pre-anesthesia Checklist: Patient identified, Emergency Drugs available, Suction available, Patient being monitored and Timeout performed

## 2014-11-17 NOTE — Transfer of Care (Signed)
Immediate Anesthesia Transfer of Care Note  Patient: Jeremy Lawrence  Procedure(s) Performed: Procedure(s): COLONOSCOPY WITH PROPOFOL (N/A)  Patient Location: PACU  Anesthesia Type:General  Level of Consciousness: awake  Airway & Oxygen Therapy: Patient Spontanous Breathing and Patient connected to nasal cannula oxygen  Post-op Assessment: Report given to RN and Post -op Vital signs reviewed and stable  Post vital signs: Reviewed and stable  Last Vitals:  Filed Vitals:   11/17/14 0817  BP: 116/73  Pulse: 48  Temp: 36.3 C  Resp: 22    Complications: No apparent anesthesia complications

## 2014-11-20 LAB — SURGICAL PATHOLOGY

## 2014-11-27 ENCOUNTER — Encounter: Payer: Self-pay | Admitting: Unknown Physician Specialty

## 2018-02-25 ENCOUNTER — Encounter: Payer: Self-pay | Admitting: *Deleted

## 2018-02-26 ENCOUNTER — Ambulatory Visit: Payer: Medicare Other | Admitting: Anesthesiology

## 2018-02-26 ENCOUNTER — Encounter: Payer: Self-pay | Admitting: Anesthesiology

## 2018-02-26 ENCOUNTER — Encounter
Admission: RE | Disposition: A | Discharge status: Home / Self Care | Payer: Self-pay | Source: Ambulatory Visit | Attending: Unknown Physician Specialty

## 2018-02-26 ENCOUNTER — Ambulatory Visit
Admission: RE | Admit: 2018-02-26 | Discharge: 2018-02-26 | Disposition: A | Discharge status: Home / Self Care | Payer: Medicare Other | Source: Ambulatory Visit | Attending: Unknown Physician Specialty | Admitting: Unknown Physician Specialty

## 2018-02-26 ENCOUNTER — Other Ambulatory Visit: Payer: Self-pay

## 2018-02-26 DIAGNOSIS — D124 Benign neoplasm of descending colon: Secondary | ICD-10-CM | POA: Insufficient documentation

## 2018-02-26 DIAGNOSIS — N189 Chronic kidney disease, unspecified: Secondary | ICD-10-CM | POA: Insufficient documentation

## 2018-02-26 DIAGNOSIS — Z7982 Long term (current) use of aspirin: Secondary | ICD-10-CM | POA: Insufficient documentation

## 2018-02-26 DIAGNOSIS — Z79899 Other long term (current) drug therapy: Secondary | ICD-10-CM | POA: Insufficient documentation

## 2018-02-26 DIAGNOSIS — D123 Benign neoplasm of transverse colon: Secondary | ICD-10-CM | POA: Diagnosis not present

## 2018-02-26 DIAGNOSIS — M1990 Unspecified osteoarthritis, unspecified site: Secondary | ICD-10-CM | POA: Insufficient documentation

## 2018-02-26 DIAGNOSIS — I493 Ventricular premature depolarization: Secondary | ICD-10-CM | POA: Insufficient documentation

## 2018-02-26 DIAGNOSIS — Z880 Allergy status to penicillin: Secondary | ICD-10-CM | POA: Diagnosis not present

## 2018-02-26 DIAGNOSIS — Z86010 Personal history of colonic polyps: Secondary | ICD-10-CM | POA: Insufficient documentation

## 2018-02-26 DIAGNOSIS — I714 Abdominal aortic aneurysm, without rupture: Secondary | ICD-10-CM | POA: Diagnosis not present

## 2018-02-26 DIAGNOSIS — K589 Irritable bowel syndrome without diarrhea: Secondary | ICD-10-CM | POA: Diagnosis not present

## 2018-02-26 DIAGNOSIS — I129 Hypertensive chronic kidney disease with stage 1 through stage 4 chronic kidney disease, or unspecified chronic kidney disease: Secondary | ICD-10-CM | POA: Diagnosis not present

## 2018-02-26 DIAGNOSIS — Z1211 Encounter for screening for malignant neoplasm of colon: Secondary | ICD-10-CM | POA: Insufficient documentation

## 2018-02-26 DIAGNOSIS — E785 Hyperlipidemia, unspecified: Secondary | ICD-10-CM | POA: Diagnosis not present

## 2018-02-26 DIAGNOSIS — Z96642 Presence of left artificial hip joint: Secondary | ICD-10-CM | POA: Insufficient documentation

## 2018-02-26 HISTORY — PX: COLONOSCOPY WITH PROPOFOL: SHX5780

## 2018-02-26 HISTORY — DX: Calculus of kidney: N20.0

## 2018-02-26 HISTORY — DX: Cardiac arrhythmia, unspecified: I49.9

## 2018-02-26 SURGERY — COLONOSCOPY WITH PROPOFOL
Anesthesia: General

## 2018-02-26 MED ORDER — PROPOFOL 500 MG/50ML IV EMUL
INTRAVENOUS | Status: AC
  Filled 2018-02-26: qty 50

## 2018-02-26 MED ORDER — PHENYLEPHRINE HCL 10 MG/ML IJ SOLN
INTRAMUSCULAR | Status: DC | PRN
  Administered 2018-02-26: 100 ug via INTRAVENOUS

## 2018-02-26 MED ORDER — FENTANYL CITRATE (PF) 100 MCG/2ML IJ SOLN
INTRAMUSCULAR | Status: DC | PRN
  Administered 2018-02-26 (×3): 50 ug via INTRAVENOUS

## 2018-02-26 MED ORDER — PROPOFOL 500 MG/50ML IV EMUL
INTRAVENOUS | Status: DC | PRN
  Administered 2018-02-26: 180 ug/kg/min via INTRAVENOUS

## 2018-02-26 MED ORDER — FENTANYL CITRATE (PF) 100 MCG/2ML IJ SOLN
INTRAMUSCULAR | Status: AC
  Filled 2018-02-26: qty 2

## 2018-02-26 MED ORDER — LIDOCAINE 2% (20 MG/ML) 5 ML SYRINGE
INTRAMUSCULAR | Status: DC | PRN
  Administered 2018-02-26: 30 mg via INTRAVENOUS

## 2018-02-26 MED ORDER — VANCOMYCIN HCL IN DEXTROSE 1-5 GM/200ML-% IV SOLN
1000.0000 mg | Freq: Once | INTRAVENOUS | Status: AC
  Administered 2018-02-26: 1000 mg via INTRAVENOUS

## 2018-02-26 MED ORDER — PROPOFOL 10 MG/ML IV BOLUS
INTRAVENOUS | Status: DC | PRN
  Administered 2018-02-26: 100 mg via INTRAVENOUS

## 2018-02-26 MED ORDER — GENTAMICIN SULFATE 40 MG/ML IJ SOLN
100.0000 mg | Freq: Once | INTRAVENOUS | Status: AC
  Administered 2018-02-26: 100 mg via INTRAVENOUS
  Filled 2018-02-26: qty 2.5

## 2018-02-26 MED ORDER — SODIUM CHLORIDE 0.9 % IV SOLN: INTRAVENOUS | Status: DC

## 2018-02-26 MED ORDER — EPHEDRINE SULFATE 50 MG/ML IJ SOLN
INTRAMUSCULAR | Status: DC | PRN
  Administered 2018-02-26: 10 mg via INTRAVENOUS

## 2018-02-26 MED ORDER — VANCOMYCIN HCL IN DEXTROSE 1-5 GM/200ML-% IV SOLN
INTRAVENOUS | Status: AC
  Filled 2018-02-26: qty 200

## 2018-02-26 MED ORDER — LIDOCAINE HCL (PF) 2 % IJ SOLN
INTRAMUSCULAR | Status: AC
  Filled 2018-02-26: qty 10

## 2018-02-26 MED ORDER — SODIUM CHLORIDE 0.9 % IV SOLN
INTRAVENOUS | Status: DC
  Administered 2018-02-26: 09:00:00 via INTRAVENOUS

## 2018-02-26 MED ORDER — VANCOMYCIN HCL 1 G IV SOLR: 1000.0000 mg | INTRAVENOUS | Status: DC

## 2018-02-26 NOTE — Anesthesia Preprocedure Evaluation (Signed)
Anesthesia Evaluation  Patient identified by MRN, date of birth, ID band Patient awake    Reviewed: Allergy & Precautions, NPO status , Patient's Chart, lab work & pertinent test results, reviewed documented beta blocker date and time   Airway Mallampati: III  TM Distance: >3 FB     Dental  (+) Chipped   Pulmonary           Cardiovascular hypertension, Pt. on medications + dysrhythmias      Neuro/Psych    GI/Hepatic   Endo/Other    Renal/GU Renal disease     Musculoskeletal  (+) Arthritis ,   Abdominal   Peds  Hematology   Anesthesia Other Findings Hx of PVCs.  Reproductive/Obstetrics                             Anesthesia Physical Anesthesia Plan  ASA: III  Anesthesia Plan: General   Post-op Pain Management:    Induction: Intravenous  PONV Risk Score and Plan:   Airway Management Planned:   Additional Equipment:   Intra-op Plan:   Post-operative Plan:   Informed Consent: I have reviewed the patients History and Physical, chart, labs and discussed the procedure including the risks, benefits and alternatives for the proposed anesthesia with the patient or authorized representative who has indicated his/her understanding and acceptance.     Plan Discussed with: CRNA  Anesthesia Plan Comments:         Anesthesia Quick Evaluation

## 2018-02-26 NOTE — Anesthesia Post-op Follow-up Note (Signed)
Anesthesia QCDR form completed.        

## 2018-02-26 NOTE — Transfer of Care (Signed)
Immediate Anesthesia Transfer of Care Note  Patient: Jeremy Lawrence  Procedure(s) Performed: COLONOSCOPY WITH PROPOFOL (N/A )  Patient Location: PACU and Endoscopy Unit  Anesthesia Type:General  Level of Consciousness: sedated  Airway & Oxygen Therapy: Patient Spontanous Breathing and Patient connected to nasal cannula oxygen  Post-op Assessment: Report given to RN and Post -op Vital signs reviewed and stable  Post vital signs: Reviewed and stable  Last Vitals:  Vitals Value Taken Time  BP    Temp    Pulse    Resp    SpO2      Last Pain:  Vitals:   02/26/18 1009  TempSrc: (P) Tympanic  PainSc:          Complications: No apparent anesthesia complications

## 2018-02-26 NOTE — Anesthesia Postprocedure Evaluation (Signed)
Anesthesia Post Note  Patient: [name]  Procedure(s) Performed: COLONOSCOPY WITH PROPOFOL (N/A )  Patient location during evaluation: Endoscopy Anesthesia Type: General Level of consciousness: awake and alert Pain management: pain level controlled Vital Signs Assessment: post-procedure vital signs reviewed and stable Respiratory status: spontaneous breathing, nonlabored ventilation, respiratory function stable and patient connected to nasal cannula oxygen Cardiovascular status: blood pressure returned to baseline and stable Postop Assessment: no apparent nausea or vomiting Anesthetic complications: no     Last Vitals:  Vitals:   02/26/18 1029 02/26/18 1039  BP: 134/77 (!) 157/79  Pulse: (!) 55 (!) 46  Resp: 15 11  Temp:    SpO2: 100% 100%    Last Pain:  Vitals:   02/26/18 1039  TempSrc:   PainSc: 0-No pain                 Colby Reels S

## 2018-02-26 NOTE — Op Note (Signed)
[facility] Patient Name: Jeremy Lawrence Procedure Date: 02/26/2018 9:38 AM MRN: [id number] Account #: [id number] Date of Birth: 03/15/1951 Admit Type: Outpatient Age: 67 Room: [facility] ENDO ROOM 1 Gender: Male Note Status: Finalized Procedure:            Colonoscopy Indications:          High risk colon cancer surveillance: Personal history                        of colonic polyps Providers:            Manya Silvas, MD Referring MD:         Leonie Douglas. Doy Hutching, MD (Referring MD) Medicines:            Propofol per Anesthesia Complications:        No immediate complications. Procedure:            Pre-Anesthesia Assessment:                       - After reviewing the risks and benefits, the patient                        was deemed in satisfactory condition to undergo the                        procedure.                       After obtaining informed consent, the colonoscope was                        passed under direct vision. Throughout the procedure,                        the patient's blood pressure, pulse, and oxygen                        saturations were monitored continuously. The                        Colonoscope was introduced through the anus and                        advanced to the the cecum, identified by appendiceal                        orifice and ileocecal valve. The colonoscopy was                        performed without difficulty. The patient tolerated the                        procedure well. The quality of the bowel preparation                        was excellent. Findings:      Two sessile polyps were found in the splenic flexure. The polyps were       diminutive in size. These polyps were removed with a jumbo cold forceps.       Resection and retrieval were complete.      A diminutive  polyp was found in the descending colon. The polyp was       sessile. The polyp was removed with a jumbo cold forceps. Resection  and       retrieval were complete.      A diminutive polyp was found in the rectum. The polyp was sessile. The       polyp was removed with a jumbo cold forceps. Resection and retrieval       were complete. Impression:           - Two diminutive polyps at the splenic flexure, removed                        with a jumbo cold forceps. Resected and retrieved.                       - One diminutive polyp in the descending colon, removed                        with a jumbo cold forceps. Resected and retrieved.                       - One diminutive polyp in the rectum, removed with a                        jumbo cold forceps. Resected and retrieved. Recommendation:       - Await pathology results. Manya Silvas, MD 02/26/2018 10:08:58 AM This report has been signed electronically. Number of Addenda: 0 Note Initiated On: 02/26/2018 9:38 AM Scope Withdrawal Time: 0 hours 13 minutes 24 seconds  Total Procedure Duration: 0 hours 20 minutes 4 seconds       [facility]

## 2018-02-26 NOTE — H&P (Signed)
Primary Care Physician:  Idelle Crouch, MD Primary Gastroenterologist:  Dr. Vira Agar  Pre-Procedure History & Physical: HPI:  Jeremy Lawrence is a 67 y.o. male is here for an colonoscopy.  For personal history of colon polyps.  Last colon was 11/17/14 and polyps removed.   Past Medical History:  Diagnosis Date  . AAA (abdominal aortic aneurysm) ([place])   . Arthritis   . Chronic kidney disease    Kidney Stone  . Dysrhythmia    PVC's, sinus bradycardia  . Gastritis   . Hyperlipidemia   . Hypertension   . IBS (irritable bowel syndrome)   . Kidney stone   . Plantar fasciitis   . Tendinitis of elbow   . Umbilical hernia     Past Surgical History:  Procedure Laterality Date  . COLONOSCOPY WITH PROPOFOL N/A 11/17/2014   Procedure: COLONOSCOPY WITH PROPOFOL;  Surgeon: Manya Silvas, MD;  Location: [facility] ENDOSCOPY;  Service: Endoscopy;  Laterality: N/A;  . EYE SURGERY     Cataract Extraction  . JOINT REPLACEMENT     LT Hip    Prior to Admission medications   Medication Sig Start Date End Date Taking? Authorizing Provider  amLODipine (NORVASC) 10 MG tablet Take 10 mg by mouth daily.   Yes [provider]  aspirin EC 81 MG tablet Take 81 mg by mouth daily.   Yes [provider]  losartan-hydrochlorothiazide (HYZAAR) 100-25 MG per tablet Take 1 tablet by mouth daily.   Yes [provider]  tamsulosin (FLOMAX) 0.4 MG CAPS capsule Take 0.4 mg by mouth daily.   Yes [provider]  venlafaxine (EFFEXOR) 75 MG tablet Take 75 mg by mouth daily.   Yes [provider]  zolpidem (AMBIEN) 10 MG tablet Take 10 mg by mouth at bedtime as needed for sleep.   Yes [provider]  albuterol (PROVENTIL HFA;VENTOLIN HFA) 108 (90 BASE) MCG/ACT inhaler Inhale 2 puffs into the lungs every 6 (six) hours as needed for wheezing or shortness of breath.    [provider]  sertraline (ZOLOFT) 25 MG tablet Take 25 mg by mouth daily.     [provider]    Allergies as of 12/14/2017 - Review Complete 11/17/2014  Allergen Reaction Noted  . Penicillins Hives 11/16/2014    History reviewed. No pertinent family history.  Social History   Socioeconomic History  . Marital status: Married    Spouse name: Not on file  . Number of children: Not on file  . Years of education: Not on file  . Highest education level: Not on file  Occupational History  . Not on file  Social Needs  . Financial resource strain: Not on file  . Food insecurity:    Worry: Not on file    Inability: Not on file  . Transportation needs:    Medical: Not on file    Non-medical: Not on file  Tobacco Use  . Smoking status: Never Smoker  . Smokeless tobacco: Never Used  Substance and Sexual Activity  . Alcohol use: No  . Drug use: No  . Sexual activity: Not on file  Lifestyle  . Physical activity:    Days per week: Not on file    Minutes per session: Not on file  . Stress: Not on file  Relationships  . Social connections:    Talks on phone: Not on file    Gets together: Not on file    Attends religious service: Not on file  Active member of club or organization: Not on file    Attends meetings of clubs or organizations: Not on file    Relationship status: Not on file  . Intimate partner violence:    Fear of current or ex partner: Not on file    Emotionally abused: Not on file    Physically abused: Not on file    Forced sexual activity: Not on file  Other Topics Concern  . Not on file  Social History Narrative  . Not on file    Review of Systems: See HPI, otherwise negative ROS  Physical Exam: BP (!) 133/91   Pulse 63   Temp (!) 97 F (36.1 C) (Tympanic)   Resp 20   Ht 5\' 10"  (1.778 m)   Wt 99.8 kg   SpO2 100%   BMI 31.57 kg/m  General:   Alert,  pleasant and cooperative in NAD Head:  Normocephalic and atraumatic. Neck:  Supple; no masses or thyromegaly. Lungs:  Clear throughout to auscultation.    Heart:   Regular rate and rhythm. Abdomen:  Soft, nontender and nondistended. Normal bowel sounds, without guarding, and without rebound.   Neurologic:  Alert and  oriented x4;  grossly normal neurologically.  Impression/Plan: Jeremy Lawrence is here for an colonoscopy to be performed for Personal history of colon polyps.  Risks, benefits, limitations, and alternatives regarding  colonoscopy have been reviewed with the patient.  Questions have been answered.  All parties agreeable.   Gaylyn Cheers, MD  02/26/2018, 9:39 AM

## 2018-03-01 LAB — SURGICAL PATHOLOGY

## 2018-03-02 ENCOUNTER — Encounter: Payer: Self-pay | Admitting: Unknown Physician Specialty

## 2019-06-27 ENCOUNTER — Ambulatory Visit: Payer: Medicare Other | Attending: Internal Medicine

## 2019-06-27 DIAGNOSIS — Z23 Encounter for immunization: Secondary | ICD-10-CM

## 2019-06-27 NOTE — Progress Notes (Signed)
   Covid-19 Vaccination Clinic  Name:  Jeremy Lawrence    MRN: [id number] DOB: 05-10-1950  06/27/2019  [name] was observed post Covid-19 immunization for 15 minutes without incidence. He was provided with Vaccine Information Sheet and instruction to access the V-Safe system.   [name] was instructed to call 911 with any severe reactions post vaccine: Marland Kitchen Difficulty breathing  . Swelling of your face and throat  . A fast heartbeat  . A bad rash all over your body  . Dizziness and weakness    Immunizations Administered    Name Date Dose VIS Date Route   Pfizer COVID-19 Vaccine 06/27/2019  9:17 AM 0.3 mL 04/15/2019 Intramuscular   Manufacturer: [place]   Lot: [id number]   [place]: [id number]

## 2019-07-19 ENCOUNTER — Ambulatory Visit: Payer: Medicare Other | Attending: Internal Medicine

## 2019-07-19 DIAGNOSIS — Z23 Encounter for immunization: Secondary | ICD-10-CM

## 2019-07-19 NOTE — Progress Notes (Signed)
   Covid-19 Vaccination Clinic  Name:  Jeremy Lawrence    MRN: [id number] DOB: 23-Feb-1951  07/19/2019  [name] was observed post Covid-19 immunization for 15 minutes without incident. He was provided with Vaccine Information Sheet and instruction to access the V-Safe system.   [name] was instructed to call 911 with any severe reactions post vaccine: Marland Kitchen Difficulty breathing  . Swelling of face and throat  . A fast heartbeat  . A bad rash all over body  . Dizziness and weakness   Immunizations Administered    Name Date Dose VIS Date Route   Pfizer COVID-19 Vaccine 07/19/2019  9:19 AM 0.3 mL 04/15/2019 Intramuscular   Manufacturer: [place]   Lot: [id number]   [place]: [id number]

## 2022-02-20 ENCOUNTER — Other Ambulatory Visit: Payer: Self-pay | Admitting: Physician Assistant

## 2022-02-20 DIAGNOSIS — Z8679 Personal history of other diseases of the circulatory system: Secondary | ICD-10-CM

## 2022-02-20 DIAGNOSIS — R1013 Epigastric pain: Secondary | ICD-10-CM

## 2022-02-27 ENCOUNTER — Ambulatory Visit
Admission: RE | Admit: 2022-02-27 | Discharge: 2022-02-27 | Disposition: A | Discharge status: Home / Self Care | Payer: Medicare Other | Source: Ambulatory Visit | Attending: Physician Assistant | Admitting: Physician Assistant

## 2022-02-27 DIAGNOSIS — R1013 Epigastric pain: Secondary | ICD-10-CM | POA: Insufficient documentation

## 2022-02-27 DIAGNOSIS — Z8679 Personal history of other diseases of the circulatory system: Secondary | ICD-10-CM | POA: Insufficient documentation

## 2022-02-28 NOTE — Progress Notes (Signed)
Reviewed pt AVS and pt verbalized understanding of all DC teaching and instructions. Pt is own transportation home.

## 2022-03-04 ENCOUNTER — Other Ambulatory Visit: Payer: Self-pay | Admitting: Internal Medicine

## 2022-03-04 DIAGNOSIS — R1084 Generalized abdominal pain: Secondary | ICD-10-CM

## 2022-03-04 DIAGNOSIS — R110 Nausea: Secondary | ICD-10-CM

## 2022-03-11 ENCOUNTER — Ambulatory Visit
Admission: RE | Admit: 2022-03-11 | Discharge: 2022-03-11 | Disposition: A | Discharge status: Home / Self Care | Payer: Medicare Other | Source: Ambulatory Visit | Attending: Internal Medicine | Admitting: Internal Medicine

## 2022-03-11 DIAGNOSIS — R110 Nausea: Secondary | ICD-10-CM | POA: Insufficient documentation

## 2022-03-11 DIAGNOSIS — R1084 Generalized abdominal pain: Secondary | ICD-10-CM | POA: Insufficient documentation

## 2022-03-11 MED ORDER — IOHEXOL 300 MG/ML  SOLN
100.0000 mL | Freq: Once | INTRAMUSCULAR | Status: AC | PRN
  Administered 2022-03-11: 100 mL via INTRAVENOUS

## 2022-04-02 ENCOUNTER — Other Ambulatory Visit: Payer: Self-pay | Admitting: Internal Medicine

## 2022-04-02 DIAGNOSIS — K7689 Other specified diseases of liver: Secondary | ICD-10-CM

## 2022-04-10 ENCOUNTER — Ambulatory Visit
Admission: RE | Admit: 2022-04-10 | Discharge: 2022-04-10 | Disposition: A | Discharge status: Home / Self Care | Payer: Medicare Other | Source: Ambulatory Visit | Attending: Internal Medicine | Admitting: Internal Medicine

## 2022-04-10 DIAGNOSIS — K7689 Other specified diseases of liver: Secondary | ICD-10-CM

## 2022-04-10 HISTORY — PX: IR RADIOLOGIST EVAL & MGMT: IMG5224

## 2022-04-10 NOTE — H&P (Signed)
Interventional Radiology - Clinic Visit, Initial H&P    Referring Provider: Idelle Crouch, MD  Reason for Visit: Liver cysts     History of Present Illness  The patient was referred to the interventional radiology clinic for further evaluation and management of hepatic cysts that were thought to be contributing to the patient's initial presentation of epigastric discomfort with associated nausea.    The patient was initially evaluated by Dr. Doy Lawrence on March 03, 2022, and then had a CT scan of the abdomen and pelvis which demonstrated multiple enlarging liver cysts when compared to prior examination in 2010.  Largest of the cysts were located in the left hepatic lobe and inferior right hepatic lobe, measuring up to 9-10 cm in size.  Since seeing his intensivist, the patient reports having taken a medication (he believes it may have been sucralfate), and that he no longer reports any symptoms on today's presentation.  He denies any epigastric pain, tenderness or nausea today.    Additional Past Medical History Past Medical History:  Diagnosis Date   AAA (abdominal aortic aneurysm) ([place])    Arthritis    Chronic kidney disease    Kidney Stone   Dysrhythmia    PVC's, sinus bradycardia   Gastritis    Hyperlipidemia    Hypertension    IBS (irritable bowel syndrome)    Kidney stone    Plantar fasciitis    Tendinitis of elbow    Umbilical hernia      Surgical History  Past Surgical History:  Procedure Laterality Date   COLONOSCOPY WITH PROPOFOL N/A 11/17/2014   Procedure: COLONOSCOPY WITH PROPOFOL;  Surgeon: Manya Silvas, MD;  Location: [facility];  Service: Endoscopy;  Laterality: N/A;   COLONOSCOPY WITH PROPOFOL N/A 02/26/2018   Procedure: COLONOSCOPY WITH PROPOFOL;  Surgeon: Manya Silvas, MD;  Location: [facility] ENDOSCOPY;  Service: Endoscopy;  Laterality: N/A;   EYE SURGERY     Cataract Extraction   JOINT REPLACEMENT     LT Hip     Medications  I have  reviewed the current medication list. Refer to chart for details. Current Outpatient Medications  Medication Instructions   albuterol (PROVENTIL HFA;VENTOLIN HFA) 108 (90 BASE) MCG/ACT inhaler 2 puffs, Inhalation, Every 6 hours PRN   amLODipine (NORVASC) 10 mg, Oral, Daily   aspirin EC 81 mg, Oral, Daily   losartan-hydrochlorothiazide (HYZAAR) 100-25 MG per tablet 1 tablet, Oral, Daily   sertraline (ZOLOFT) 25 mg, Oral, Daily   tamsulosin (FLOMAX) 0.4 mg, Oral, Daily   venlafaxine (EFFEXOR) 75 mg, Oral, Daily   zolpidem (AMBIEN) 10 mg, Oral, At bedtime PRN      Allergies Allergies  Allergen Reactions   Penicillins Hives   Does patient have contrast allergy: No     Physical Exam Current Vitals Temp: 97.8 F (36.6 C) (Temp Source: Oral)  Pulse Rate: 81  Resp: 16  BP: 129/89  SpO2: 97 %        There is no height or weight on file to calculate BMI.  General: Alert and answers questions appropriately. No apparent distress. HEENT: Normocephalic, atraumatic.  Cardiac: Regular rate and rhythm.  Pulmonary: Normal work of breathing. On room air. Abdominal: Soft without distension.   Pertinent Lab Results    Latest Ref Rng & Units 12/01/2013    6:42 PM  CBC  WBC 3.8 - 10.6 x10 3/mm 3 11.3   Hemoglobin 13.0 - 18.0 g/dL 13.4   Hematocrit 40.0 - 52.0 % 39.7   Platelets  150 - 440 x10 3/mm 3 183       Latest Ref Rng & Units 12/01/2013    6:42 PM 10/14/2011    9:08 AM  CMP  Glucose 65 - 99 mg/dL 168    BUN 7 - 18 mg/dL 20    Creatinine 0.60 - 1.30 mg/dL 2.17    Sodium 136 - 145 mmol/L 136    Potassium 3.5 - 5.1 mmol/L 3.0  3.4   Chloride 98 - 107 mmol/L 100    CO2 21 - 32 mmol/L 29    Calcium 8.5 - 10.1 mg/dL 8.3        Relevant and/or Recent Imaging: CT A/P  03/14/2022  IMPRESSION: 1. No acute abnormality of the abdomen or pelvis. 2. Multiple large, simple hepatic cysts, increased in size compared to 04/03/2009. Electronically Signed   By: Ulyses Jarred M.D.    On: 03/14/2022 13:46   Assessment & Plan Jeremy Lawrence is a 71 y.o. male who was referred to IR Clinic by Dr. Milus Mallick in consultation for further evaluation and management of enlarging liver cysts.  Treatment options include image-guided aspiration or do nothing.  I reviewed the patient's imaging with him today in detail.  These liver cysts are benign and require no dedicated follow-up or treatment unless symptomatic.  Given the fact that his symptoms have improved/resolved with medical therapy, the patient elected not to pursue aspiration at this time.  I think that is reasonable, and I gave the patient our clinic information should his symptoms return and would like to attempt image guided aspiration.   Total time spent on today's visit was over 40 Minutes, including both face-to-face time and non face-to-face time, personally spent on review of chart (including labs and relevant imaging), discussing further workup and treatment options, referral to specialist if needed, reviewing outside records if pertinent, answering patient questions, and coordinating care regarding liver cysts as well as management strategy.      [name], MD  Vascular and Interventional Radiology 04/10/2022 9:39 AM

## 2023-03-30 ENCOUNTER — Other Ambulatory Visit: Payer: Self-pay | Admitting: Internal Medicine

## 2023-03-30 DIAGNOSIS — M79662 Pain in left lower leg: Secondary | ICD-10-CM

## 2023-03-31 ENCOUNTER — Other Ambulatory Visit: Payer: Self-pay | Admitting: Internal Medicine

## 2023-03-31 DIAGNOSIS — M79662 Pain in left lower leg: Secondary | ICD-10-CM

## 2023-04-07 ENCOUNTER — Ambulatory Visit
Admission: RE | Admit: 2023-04-07 | Discharge: 2023-04-07 | Disposition: A | Discharge status: Home / Self Care | Payer: Medicare Other | Source: Ambulatory Visit | Attending: Internal Medicine | Admitting: Internal Medicine

## 2023-04-07 DIAGNOSIS — M79662 Pain in left lower leg: Secondary | ICD-10-CM

## 2023-04-08 ENCOUNTER — Other Ambulatory Visit: Payer: Medicare Other

## 2023-07-16 ENCOUNTER — Encounter: Payer: Self-pay | Admitting: Gastroenterology

## 2023-07-24 ENCOUNTER — Encounter
Admission: RE | Disposition: A | Discharge status: Home / Self Care | Payer: Self-pay | Source: Ambulatory Visit | Attending: Gastroenterology

## 2023-07-24 ENCOUNTER — Ambulatory Visit: Admitting: Certified Registered"

## 2023-07-24 ENCOUNTER — Ambulatory Visit
Admission: RE | Admit: 2023-07-24 | Discharge: 2023-07-24 | Disposition: A | Discharge status: Home / Self Care | Payer: Medicare Other | Source: Ambulatory Visit | Attending: Gastroenterology | Admitting: Gastroenterology

## 2023-07-24 ENCOUNTER — Encounter: Payer: Self-pay | Admitting: Gastroenterology

## 2023-07-24 DIAGNOSIS — D122 Benign neoplasm of ascending colon: Secondary | ICD-10-CM | POA: Diagnosis not present

## 2023-07-24 DIAGNOSIS — K5730 Diverticulosis of large intestine without perforation or abscess without bleeding: Secondary | ICD-10-CM | POA: Insufficient documentation

## 2023-07-24 DIAGNOSIS — Z1211 Encounter for screening for malignant neoplasm of colon: Secondary | ICD-10-CM | POA: Insufficient documentation

## 2023-07-24 DIAGNOSIS — D120 Benign neoplasm of cecum: Secondary | ICD-10-CM | POA: Insufficient documentation

## 2023-07-24 DIAGNOSIS — I129 Hypertensive chronic kidney disease with stage 1 through stage 4 chronic kidney disease, or unspecified chronic kidney disease: Secondary | ICD-10-CM | POA: Diagnosis not present

## 2023-07-24 DIAGNOSIS — D124 Benign neoplasm of descending colon: Secondary | ICD-10-CM | POA: Insufficient documentation

## 2023-07-24 DIAGNOSIS — N189 Chronic kidney disease, unspecified: Secondary | ICD-10-CM | POA: Diagnosis not present

## 2023-07-24 HISTORY — PX: COLONOSCOPY: SHX5424

## 2023-07-24 HISTORY — PX: POLYPECTOMY: SHX5525

## 2023-07-24 SURGERY — COLONOSCOPY
Anesthesia: General

## 2023-07-24 MED ORDER — LIDOCAINE HCL (CARDIAC) PF 100 MG/5ML IV SOSY
PREFILLED_SYRINGE | INTRAVENOUS | Status: DC | PRN
  Administered 2023-07-24: 50 mg via INTRAVENOUS

## 2023-07-24 MED ORDER — PROPOFOL 10 MG/ML IV BOLUS
INTRAVENOUS | Status: AC
Start: 2023-07-24 — End: ?
  Filled 2023-07-24: qty 20

## 2023-07-24 MED ORDER — SODIUM CHLORIDE 0.9 % IV SOLN: INTRAVENOUS | Status: DC

## 2023-07-24 MED ORDER — PROPOFOL 10 MG/ML IV BOLUS
INTRAVENOUS | Status: DC | PRN
  Administered 2023-07-24: 100 mg via INTRAVENOUS
  Administered 2023-07-24: 150 ug/kg/min via INTRAVENOUS

## 2023-07-24 MED ORDER — SIMETHICONE 40 MG/0.6ML PO SUSP
ORAL | Status: DC | PRN
  Administered 2023-07-24: 60 mL

## 2023-07-24 NOTE — Anesthesia Preprocedure Evaluation (Addendum)
 Anesthesia Evaluation  Patient identified by MRN, date of birth, ID band Patient awake    Reviewed: Allergy & Precautions, NPO status , Patient's Chart, lab work & pertinent test results  Airway Mallampati: II  TM Distance: >3 FB Neck ROM: full    Dental  (+) Teeth Intact   Pulmonary neg pulmonary ROS   Pulmonary exam normal breath sounds clear to auscultation       Cardiovascular Exercise Tolerance: Good hypertension, Pt. on medications Normal cardiovascular exam Rhythm:Regular Rate:Normal     Neuro/Psych negative neurological ROS  negative psych ROS   GI/Hepatic negative GI ROS, Neg liver ROS,,,  Endo/Other  negative endocrine ROS  Class 3 obesity  Renal/GU negative Renal ROS  negative genitourinary   Musculoskeletal   Abdominal   Peds negative pediatric ROS (+)  Hematology negative hematology ROS (+)   Anesthesia Other Findings Past Medical History: No date: AAA (abdominal aortic aneurysm) ([facility]) No date: Arthritis No date: Chronic kidney disease     Comment:  Kidney Stone No date: Dysrhythmia     Comment:  PVC's, sinus bradycardia No date: Gastritis No date: Hyperlipidemia No date: Hypertension No date: IBS (irritable bowel syndrome) No date: Kidney stone No date: Plantar fasciitis No date: Tendinitis of elbow No date: Umbilical hernia  Past Surgical History: No date: CATARACT EXTRACTION 11/17/2014: COLONOSCOPY WITH PROPOFOL; N/A     Comment:  Procedure: COLONOSCOPY WITH PROPOFOL;  Surgeon: Scot Jun, MD;  Location: [facility] ENDOSCOPY;  Service:               Endoscopy;  Laterality: N/A; 02/26/2018: COLONOSCOPY WITH PROPOFOL; N/A     Comment:  Procedure: COLONOSCOPY WITH PROPOFOL;  Surgeon: Scot Jun, MD;  Location: [facility] ENDOSCOPY;  Service:               Endoscopy;  Laterality: N/A; No date: EYE SURGERY     Comment:  Cataract Extraction 04/10/2022: IR  RADIOLOGIST EVAL & MGMT No date: JOINT REPLACEMENT     Comment:  LT Hip No date: TONSILLECTOMY  BMI    Body Mass Index: 34.84 kg/m      Reproductive/Obstetrics negative OB ROS                              Anesthesia Physical Anesthesia Plan  ASA: 2  Anesthesia Plan: General   Post-op Pain Management:    Induction: Intravenous  PONV Risk Score and Plan: Propofol infusion and TIVA  Airway Management Planned: Natural Airway and Nasal Cannula  Additional Equipment:   Intra-op Plan:   Post-operative Plan:   Informed Consent: I have reviewed the patients History and Physical, chart, labs and discussed the procedure including the risks, benefits and alternatives for the proposed anesthesia with the patient or authorized representative who has indicated his/her understanding and acceptance.     Dental Advisory Given  Plan Discussed with: CRNA and Surgeon  Anesthesia Plan Comments:         Anesthesia Quick Evaluation

## 2023-07-24 NOTE — Anesthesia Procedure Notes (Signed)
 Date/Time: 07/24/2023 2:30 PM  Performed by: Ginger Carne, CRNAPre-anesthesia Checklist: Patient identified, Emergency Drugs available, Suction available, Patient being monitored and Timeout performed Patient Re-evaluated:Patient Re-evaluated prior to induction Oxygen Delivery Method: Nasal cannula Preoxygenation: Pre-oxygenation with 100% oxygen Induction Type: IV induction

## 2023-07-24 NOTE — Transfer of Care (Signed)
 Immediate Anesthesia Transfer of Care Note  Patient: Jeremy Lawrence  Procedure(s) Performed: COLONOSCOPY POLYPECTOMY  Patient Location: PACU  Anesthesia Type:General  Level of Consciousness: awake, alert , and oriented  Airway & Oxygen Therapy: Patient Spontanous Breathing  Post-op Assessment: Report given to RN and Post -op Vital signs reviewed and stable  Post vital signs: Reviewed and stable  Last Vitals:  Vitals Value Taken Time  BP 98/74 07/24/23 1503  Temp    Pulse 73 07/24/23 1504  Resp 13 07/24/23 1504  SpO2 99 % 07/24/23 1504  Vitals shown include unfiled device data.  Last Pain:  Vitals:   07/24/23 1239  TempSrc: Temporal  PainSc: 0-No pain         Complications: No notable events documented.

## 2023-07-24 NOTE — Anesthesia Postprocedure Evaluation (Signed)
 Anesthesia Post Note  Patient: Jeremy Lawrence  Procedure(s) Performed: COLONOSCOPY POLYPECTOMY  Patient location during evaluation: Endoscopy Anesthesia Type: General Level of consciousness: awake and alert Pain management: pain level controlled Vital Signs Assessment: post-procedure vital signs reviewed and stable Respiratory status: spontaneous breathing, nonlabored ventilation, respiratory function stable and patient connected to nasal cannula oxygen Cardiovascular status: blood pressure returned to baseline and stable Postop Assessment: no apparent nausea or vomiting Anesthetic complications: no   No notable events documented.   Last Vitals:  Vitals:   07/24/23 1513 07/24/23 1523  BP: (!) 144/101 (!) 175/101  Pulse:  (!) 53  Resp:  18  Temp:    SpO2:  100%    Last Pain:  Vitals:   07/24/23 1523  TempSrc:   PainSc: 0-No pain                 Cleda Mccreedy Giannis Corpuz

## 2023-07-24 NOTE — H&P (Signed)
 Pre-Procedure H&P   Patient ID: Jeremy Lawrence is a 73 y.o. male.  Gastroenterology Provider: [name], DO  Referring Provider: Fransico Setters, NP PCP: Marguarite Arbour, MD  Date: 07/24/2023  HPI Mr. Jeremy Lawrence is a 73 y.o. male who presents today for Colonoscopy for Personal history of colon polyps .  Reports daily bowel movement without melena or hematochezia  Last colonoscopy in October 2019 with 3 adenomatous polyps.  Colonoscopy in 2016 with 5 adenomatous polyps, diverticulosis and internal hemorrhoids  Status post left hip replacement  Hemoglobin 15 MCV 84 platelets 202,000 creatinine 1.2   Past Medical History:  Diagnosis Date   AAA (abdominal aortic aneurysm) (HCC)    Arthritis    Chronic kidney disease    Kidney Stone   Dysrhythmia    PVC's, sinus bradycardia   Gastritis    Hyperlipidemia    Hypertension    IBS (irritable bowel syndrome)    Kidney stone    Plantar fasciitis    Tendinitis of elbow    Umbilical hernia     Past Surgical History:  Procedure Laterality Date   CATARACT EXTRACTION     COLONOSCOPY WITH PROPOFOL N/A 11/17/2014   Procedure: COLONOSCOPY WITH PROPOFOL;  Surgeon: Scot Jun, MD;  Location: [facility] ENDOSCOPY;  Service: Endoscopy;  Laterality: N/A;   COLONOSCOPY WITH PROPOFOL N/A 02/26/2018   Procedure: COLONOSCOPY WITH PROPOFOL;  Surgeon: Scot Jun, MD;  Location: [facility] ENDOSCOPY;  Service: Endoscopy;  Laterality: N/A;   EYE SURGERY     Cataract Extraction   IR RADIOLOGIST EVAL & MGMT  04/10/2022   JOINT REPLACEMENT     LT Hip   TONSILLECTOMY      Family History Paternal grandfather with liver cancer No other h/o GI disease or malignancy  Review of Systems  Constitutional:  Negative for activity change, appetite change, chills, diaphoresis, fatigue, fever and unexpected weight change.  HENT:  Negative for trouble swallowing and voice change.   Respiratory:  Negative for shortness of breath and  wheezing.   Cardiovascular:  Negative for chest pain, palpitations and leg swelling.  Gastrointestinal:  Negative for abdominal distention, abdominal pain, anal bleeding, blood in stool, constipation, diarrhea, nausea and vomiting.  Musculoskeletal:  Positive for arthralgias. Negative for myalgias.  Skin:  Negative for color change and pallor.  Neurological:  Negative for dizziness, syncope and weakness.  Psychiatric/Behavioral:  Negative for confusion. The patient is not nervous/anxious.   All other systems reviewed and are negative.    Medications No current facility-administered medications on file prior to encounter.   Current Outpatient Medications on File Prior to Encounter  Medication Sig Dispense Refill   albuterol (PROVENTIL HFA;VENTOLIN HFA) 108 (90 BASE) MCG/ACT inhaler Inhale 2 puffs into the lungs every 6 (six) hours as needed for wheezing or shortness of breath.     amLODipine (NORVASC) 10 MG tablet Take 10 mg by mouth daily.     aspirin EC 81 MG tablet Take 81 mg by mouth daily.     levothyroxine (SYNTHROID) 50 MCG tablet Take 50 mcg by mouth daily before breakfast.     losartan-hydrochlorothiazide (HYZAAR) 100-25 MG per tablet Take 1 tablet by mouth daily.     sertraline (ZOLOFT) 25 MG tablet Take 25 mg by mouth daily.     venlafaxine (EFFEXOR) 75 MG tablet Take 75 mg by mouth daily.     zolpidem (AMBIEN) 10 MG tablet Take 10 mg by mouth at bedtime as needed for sleep.  tamsulosin (FLOMAX) 0.4 MG CAPS capsule Take 0.4 mg by mouth daily. (Patient not taking: Reported on 07/24/2023)      Pertinent medications related to GI and procedure were reviewed by me with the patient prior to the procedure   Current Facility-Administered Medications:    0.9 %  sodium chloride infusion, , Intravenous, Continuous, [name], DO, Last Rate: 20 mL/hr at 07/24/23 1251, New Bag at 07/24/23 1251  sodium chloride 20 mL/hr at 07/24/23 1251       Allergies  Allergen  Reactions   Penicillins Hives   Allergies were reviewed by me prior to the procedure  Objective   Body mass index is 34.84 kg/m. Vitals:   07/24/23 1239  BP: (!) 132/97  Pulse: 81  Resp: 15  Temp: 97.7 F (36.5 C)  TempSrc: Temporal  SpO2: 97%  Weight: 110.1 kg  Height: 5\' 10"  (1.778 m)     Physical Exam Vitals and nursing note reviewed.  Constitutional:      General: He is not in acute distress.    Appearance: Normal appearance. He is not ill-appearing, toxic-appearing or diaphoretic.  HENT:     Head: Normocephalic and atraumatic.     Nose: Nose normal.     Mouth/Throat:     Mouth: Mucous membranes are moist.     Pharynx: Oropharynx is clear.  Eyes:     General: No scleral icterus.    Extraocular Movements: Extraocular movements intact.  Cardiovascular:     Rate and Rhythm: Normal rate and regular rhythm.     Heart sounds: Normal heart sounds. No murmur heard.    No friction rub. No gallop.  Pulmonary:     Effort: Pulmonary effort is normal. No respiratory distress.     Breath sounds: Normal breath sounds. No wheezing, rhonchi or rales.  Abdominal:     General: Bowel sounds are normal. There is no distension.     Palpations: Abdomen is soft.     Tenderness: There is no abdominal tenderness. There is no guarding or rebound.  Musculoskeletal:     Cervical back: Neck supple.     Right lower leg: No edema.     Left lower leg: No edema.  Skin:    General: Skin is warm and dry.     Coloration: Skin is not jaundiced or pale.  Neurological:     General: No focal deficit present.     Mental Status: He is alert and oriented to person, place, and time. Mental status is at baseline.  Psychiatric:        Mood and Affect: Mood normal.        Behavior: Behavior normal.        Thought Content: Thought content normal.        Judgment: Judgment normal.      Assessment:  Mr. Jeremy Lawrence is a 73 y.o. male  who presents today for Colonoscopy for Personal history  of colon polyps .  Plan:  Colonoscopy with possible intervention today  Colonoscopy with possible biopsy, control of bleeding, polypectomy, and interventions as necessary has been discussed with the patient/patient representative. Informed consent was obtained from the patient/patient representative after explaining the indication, nature, and risks of the procedure including but not limited to death, bleeding, perforation, missed neoplasm/lesions, cardiorespiratory compromise, and reaction to medications. Opportunity for questions was given and appropriate answers were provided. Patient/patient representative has verbalized understanding is amenable to undergoing the procedure.   [name], DO  [facility]  Portions of the record may have been created with voice recognition software. Occasional wrong-word or 'sound-a-like' substitutions may have occurred due to the inherent limitations of voice recognition software.  Read the chart carefully and recognize, using context, where substitutions may have occurred.

## 2023-07-24 NOTE — Interval H&P Note (Signed)
 History and Physical Interval Note: Preprocedure H&P from 07/24/23  was reviewed and there was no interval change after seeing and examining the patient.  Written consent was obtained from the patient after discussion of risks, benefits, and alternatives. Patient has consented to proceed with Colonoscopy with possible intervention   07/24/2023 2:21 PM  Jeremy Lawrence  has presented today for surgery, with the diagnosis of Hx of adenomatous colonic polyps (Z86.0101).  The various methods of treatment have been discussed with the patient and family. After consideration of risks, benefits and other options for treatment, the patient has consented to  Procedure(s): COLONOSCOPY (N/A) as a surgical intervention.  The patient's history has been reviewed, patient examined, no change in status, stable for surgery.  I have reviewed the patient's chart and labs.  Questions were answered to the patient's satisfaction.     [name]

## 2023-07-24 NOTE — Op Note (Signed)
 [facility] Gastroenterology Patient Name: Jeremy Lawrence Procedure Date: 07/24/2023 2:19 PM MRN: [id number] Account #: [id number] Date of Birth: 04-10-1951 Admit Type: Outpatient Age: 73 Room: [facility] ENDO ROOM 1 Gender: Male Note Status: Finalized Instrument Name: Colonscope [id number] Procedure:             Colonoscopy Indications:           High risk colon cancer surveillance: Personal history                         of colonic polyps Providers:             Trenda Moots, DO Referring MD:          Duane Lope. Judithann Sheen, MD (Referring MD) Medicines:             Monitored Anesthesia Care Complications:         No immediate complications. Estimated blood loss:                         Minimal. Procedure:             Pre-Anesthesia Assessment:                        - Prior to the procedure, a History and Physical was                         performed, and patient medications and allergies were                         reviewed. The patient is competent. The risks and                         benefits of the procedure and the sedation options and                         risks were discussed with the patient. All questions                         were answered and informed consent was obtained.                         Patient identification and proposed procedure were                         verified by the physician, the nurse, the anesthetist                         and the technician in the endoscopy suite. Mental                         Status Examination: alert and oriented. Airway                         Examination: normal oropharyngeal airway and neck                         mobility. Respiratory Examination: clear to  auscultation. CV Examination: RRR, no murmurs, no S3                         or S4. Prophylactic Antibiotics: The patient does not                         require prophylactic antibiotics. Prior                          Anticoagulants: The patient has taken no anticoagulant                         or antiplatelet agents. ASA Grade Assessment: III - A                         patient with severe systemic disease. After reviewing                         the risks and benefits, the patient was deemed in                         satisfactory condition to undergo the procedure. The                         anesthesia plan was to use monitored anesthesia care                         (MAC). Immediately prior to administration of                         medications, the patient was re-assessed for adequacy                         to receive sedatives. The heart rate, respiratory                         rate, oxygen saturations, blood pressure, adequacy of                         pulmonary ventilation, and response to care were                         monitored throughout the procedure. The physical                         status of the patient was re-assessed after the                         procedure.                        After obtaining informed consent, the colonoscope was                         passed under direct vision. Throughout the procedure,                         the patient's blood pressure, pulse, and oxygen  saturations were monitored continuously. The                         Colonoscope was introduced through the anus and                         advanced to the the terminal ileum, with                         identification of the appendiceal orifice and IC                         valve. The colonoscopy was performed without                         difficulty. The patient tolerated the procedure well.                         The quality of the bowel preparation was evaluated                         using the BBPS [facility] Bowel Preparation Scale) with                         scores of: Right Colon = 3, Transverse Colon = 3 and                         Left Colon = 3 (entire  mucosa seen well with no                         residual staining, small fragments of stool or opaque                         liquid). The total BBPS score equals 9. The terminal                         ileum, ileocecal valve, appendiceal orifice, and                         rectum were photographed. Findings:      The perianal and digital rectal examinations were normal. Pertinent       negatives include normal sphincter tone.      The terminal ileum appeared normal. Estimated blood loss: none.      Multiple small-mouthed diverticula were found in the sigmoid colon.       Estimated blood loss: none.      Two sessile polyps were found in the ascending colon. The polyps were 3       to 5 mm in size. These polyps were removed with a cold snare. Resection       and retrieval were complete. Estimated blood loss was minimal. Estimated       blood loss was minimal.      Three sessile polyps were found in the descending colon (2) and cecum.       The polyps were 1 to 2 mm in size. These polyps were removed with a       jumbo cold forceps. Resection and retrieval were complete. Estimated  blood loss was minimal.      Anal papilla(e) were hypertrophied. Estimated blood loss: none.      The exam was otherwise without abnormality on direct and retroflexion       views. Impression:            - The examined portion of the ileum was normal.                        - Diverticulosis in the sigmoid colon.                        - Two 3 to 5 mm polyps in the ascending colon, removed                         with a cold snare. Resected and retrieved.                        - Three 1 to 2 mm polyps in the descending colon and                         in the cecum, removed with a jumbo cold forceps.                         Resected and retrieved.                        - Anal papilla(e) were hypertrophied.                        - The examination was otherwise normal on direct and                          retroflexion views. Recommendation:        - Patient has a contact number available for                         emergencies. The signs and symptoms of potential                         delayed complications were discussed with the patient.                         Return to normal activities tomorrow. Written                         discharge instructions were provided to the patient.                        - Discharge patient to home.                        - Resume previous diet.                        - Continue present medications.                        - No ibuprofen, naproxen, or other non-steroidal  anti-inflammatory drugs for 5 days after polyp removal.                        - Await pathology results.                        - Repeat colonoscopy for surveillance based on                         pathology results.                        - Return to referring physician as previously                         scheduled.                        - The findings and recommendations were discussed with                         the patient. Procedure Code(s):     --- Professional ---                        [phone], Colonoscopy, flexible; with removal of                         tumor(s), polyp(s), or other lesion(s) by snare                         technique                        45380, 59, Colonoscopy, flexible; with biopsy, single                         or multiple Diagnosis Code(s):     --- Professional ---                        Z86.010, Personal history of colonic polyps                        D12.2, Benign neoplasm of ascending colon                        D12.4, Benign neoplasm of descending colon                        D12.0, Benign neoplasm of cecum                        K62.89, Other specified diseases of anus and rectum                        K57.30, Diverticulosis of large intestine without                         perforation or abscess without  bleeding CPT copyright 2022 [employer]. All rights reserved. The codes documented in this report are preliminary and upon coder review may  be revised to meet current compliance requirements. Attending Participation:  I personally performed the entire procedure. Elfredia Nevins, DO [name] DO, DO 07/24/2023 3:04:36 PM This report has been signed electronically. Number of Addenda: 0 Note Initiated On: 07/24/2023 2:19 PM Scope Withdrawal Time: 0 hours 16 minutes 23 seconds  Total Procedure Duration: 0 hours 21 minutes 49 seconds  Estimated Blood Loss:  Estimated blood loss was minimal.      [facility]

## 2023-07-27 LAB — SURGICAL PATHOLOGY
